# Patient Record
Sex: Male | Born: 1935 | Race: White | Marital: Married
Health system: Southern US, Community
[De-identification: ages and names within clinical notes are randomized; demographics above are authoritative.]

---

## 2015-12-29 ENCOUNTER — Ambulatory Visit
Admission: RE | Admit: 2015-12-29 | Discharge: 2015-12-29 | Disposition: A | Discharge status: Home / Self Care | Payer: PRIVATE HEALTH INSURANCE | Source: Ambulatory Visit | Attending: Family Medicine | Admitting: Family Medicine

## 2015-12-29 ENCOUNTER — Other Ambulatory Visit: Payer: Self-pay | Admitting: Family Medicine

## 2015-12-29 DIAGNOSIS — R0989 Other specified symptoms and signs involving the circulatory and respiratory systems: Secondary | ICD-10-CM

## 2016-01-02 ENCOUNTER — Other Ambulatory Visit: Payer: Self-pay | Admitting: Family Medicine

## 2016-01-02 DIAGNOSIS — K439 Ventral hernia without obstruction or gangrene: Secondary | ICD-10-CM

## 2016-01-06 ENCOUNTER — Ambulatory Visit
Admission: RE | Admit: 2016-01-06 | Discharge: 2016-01-06 | Disposition: A | Discharge status: Home / Self Care | Payer: PRIVATE HEALTH INSURANCE | Source: Ambulatory Visit | Attending: Family Medicine | Admitting: Family Medicine

## 2016-01-06 DIAGNOSIS — K439 Ventral hernia without obstruction or gangrene: Secondary | ICD-10-CM

## 2016-10-31 IMAGING — CT CT ABD-PELV W/O CM
2 of 4 series · 16 of 46 positions shown, 18 images · non-contrast
Comparison: None.

CLINICAL DATA: Right groin pain for 2 weeks. Left nephrectomy.
Diaphragmatic rupture repair surgery in 7001.

EXAM:
CT ABDOMEN AND PELVIS WITHOUT CONTRAST
TECHNIQUE: Multidetector CT imaging of the abdomen and pelvis was performed
following the standard protocol without IV contrast.

[Series 2: abd/pelvis w/(date) · axial · 0.70mm/px · z∈[-638,-258]mm · 13 of 84 slices shown, 15 images]
[im 4/84  soft-tissue]
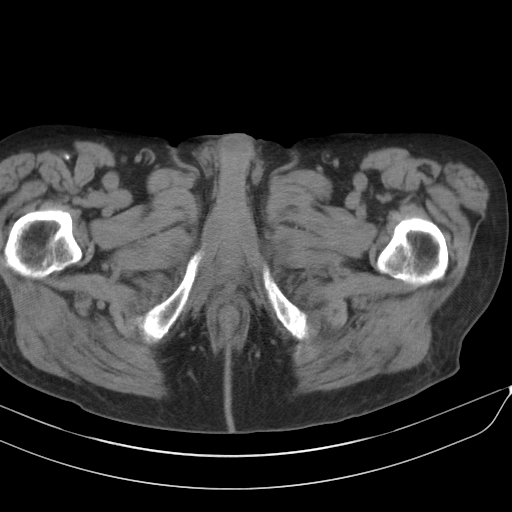
[im 4/84  bone]
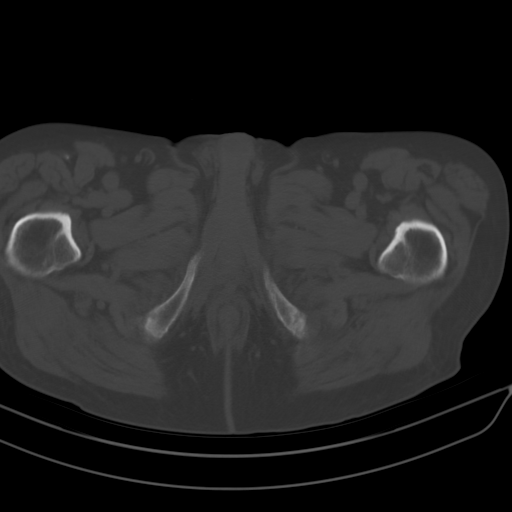
[im 10/84  soft-tissue]
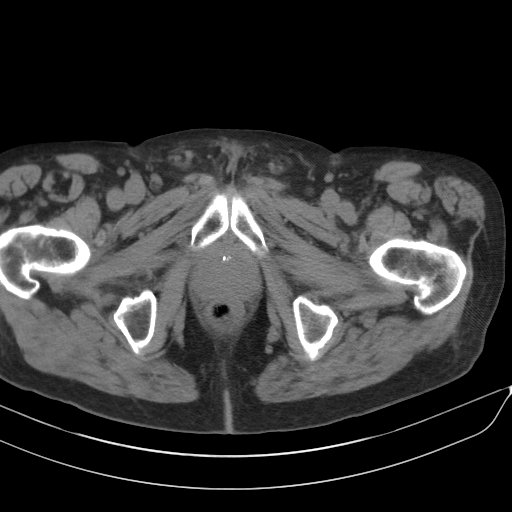
[im 17/84  soft-tissue]
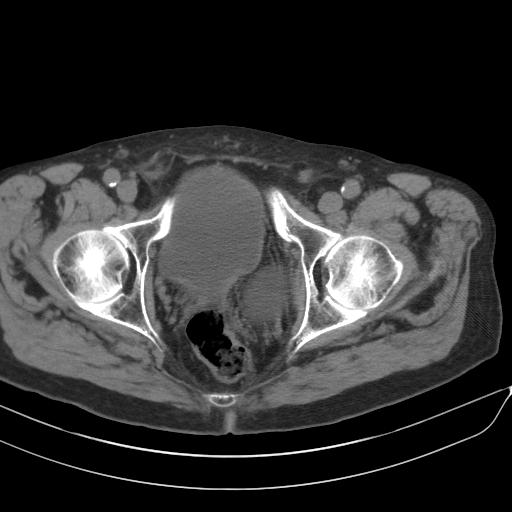
[im 24/84  soft-tissue]
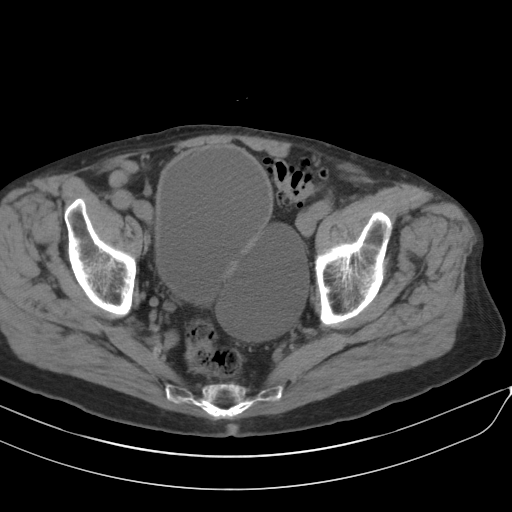
[im 30/84  soft-tissue]
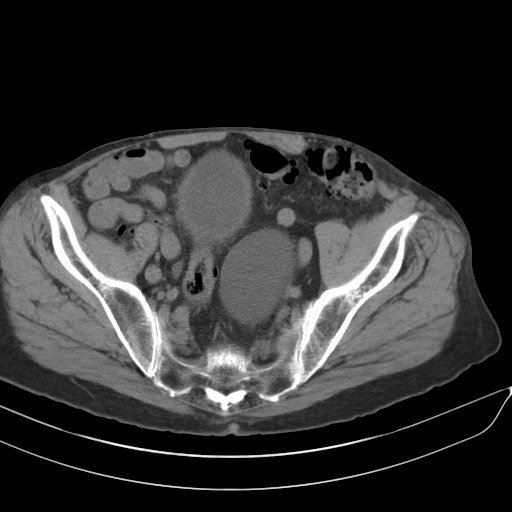
[im 37/84  soft-tissue]
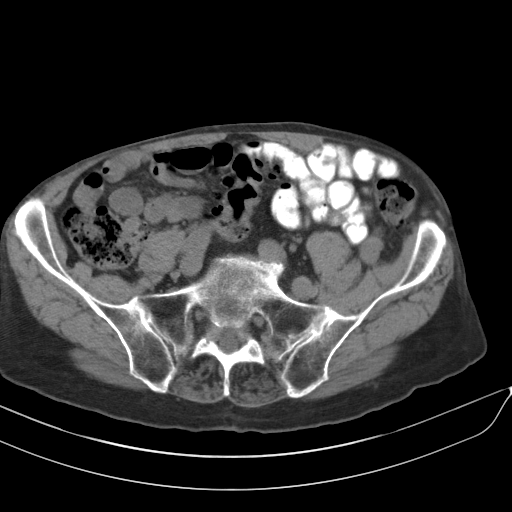
[im 44/84  soft-tissue]
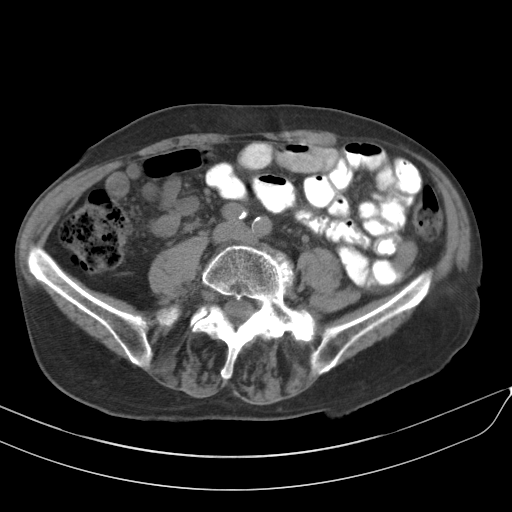
[im 47/84  soft-tissue]
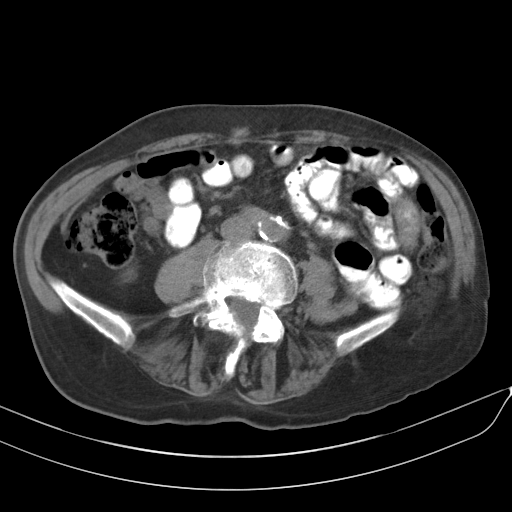
[im 54/84  soft-tissue]
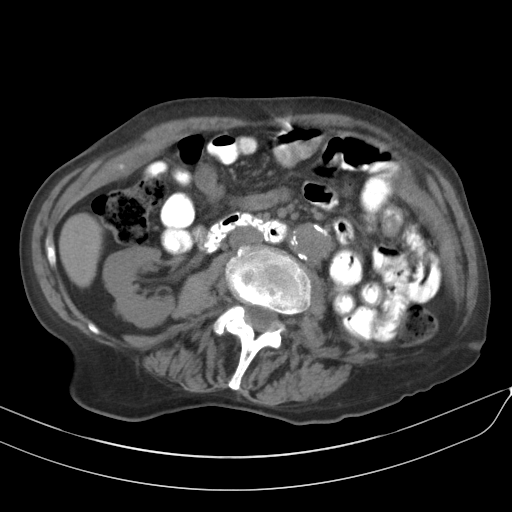
[im 54/84  bone]
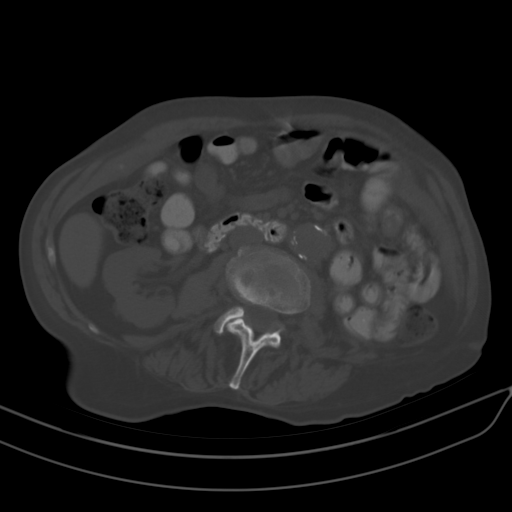
[im 60/84  soft-tissue]
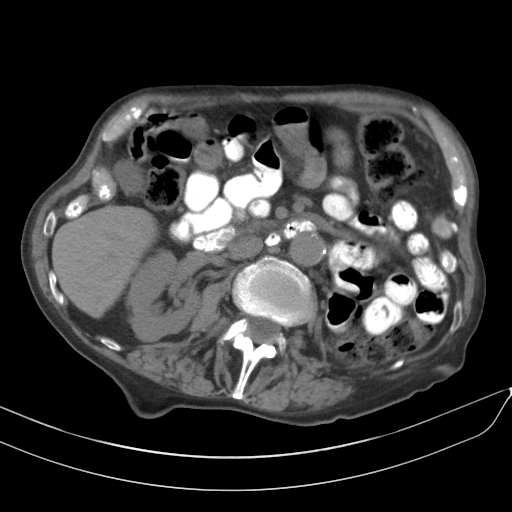
[im 67/84  soft-tissue]
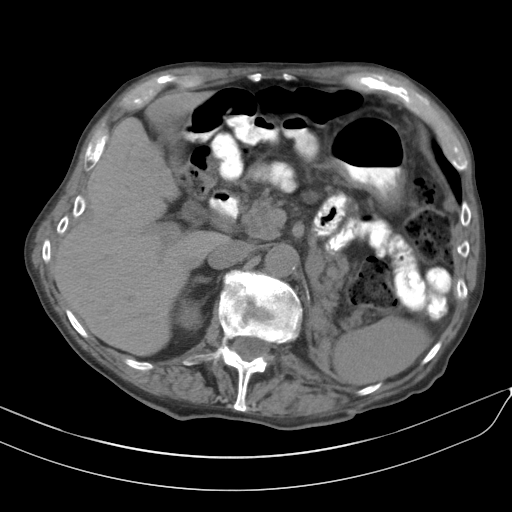
[im 74/84  soft-tissue]
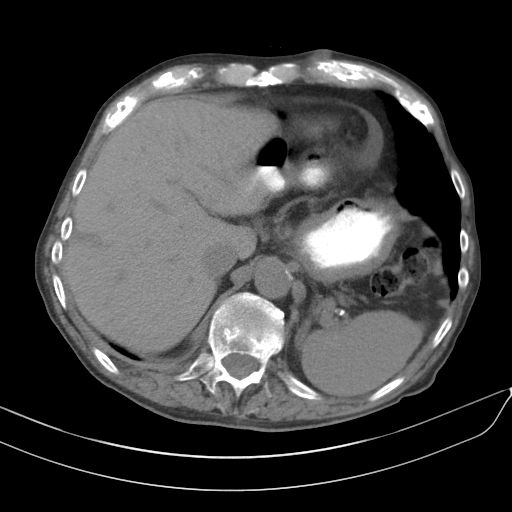
[im 80/84  soft-tissue]
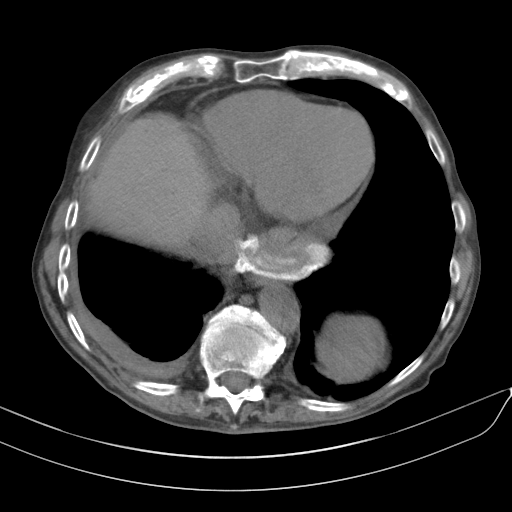

[Series 3: cor · coronal · 0.68mm/px · 3 of 85 slices shown]
[im 29/85  soft-tissue]
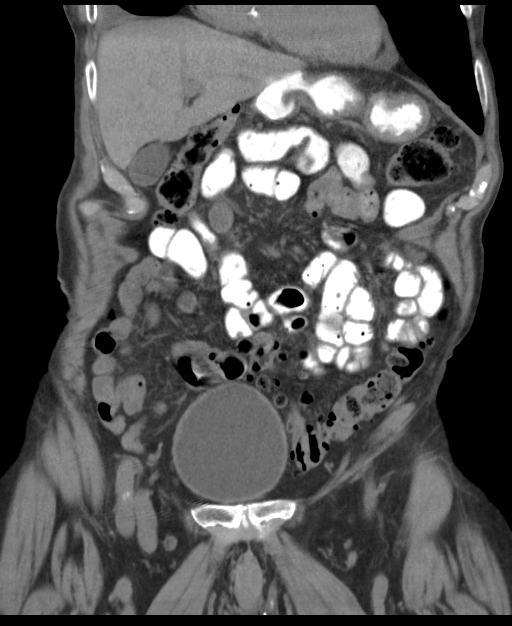
[im 38/85  soft-tissue]
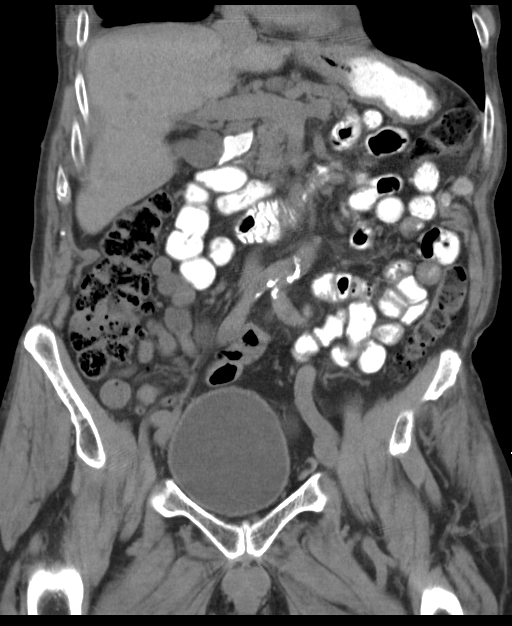
[im 47/85  soft-tissue]
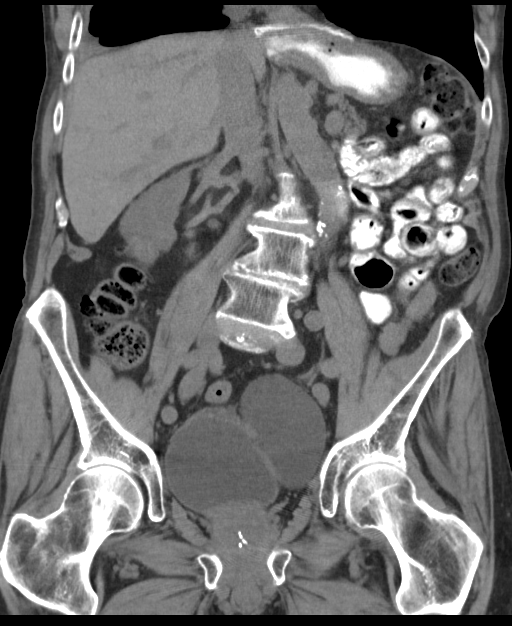

[16 of 46 positions shown; findings below may reference images not displayed]

FINDINGS: There is pleural thickening extending from the posterior right hemi
thorax, laterally, and to the anterior right hemi thorax. The
thickening is characterize by area is of soft tissue and fluid
density. There is a focal parenchymal ovoid opacity at the post role
lateral right lung base on image number 1 measuring 2.0 x 3.4 cm
that is partially imaged. Linear opacities extend centrally. This
likely represents round atelectasis.

Small pericardial effusion.  Moderate-sized hiatal hernia.

Unenhanced liver, gallbladder, spleen, pancreas are within normal
limits.

Right adrenal gland is unremarkable. The right kidney is somewhat
lobulated. The left kidney is absent. Left adrenal gland is
difficult to visualize.

The appendix is within normal limits. Diverticulosis of the
descending and sigmoid colon is present. There is no obvious of
evidence of acute diverticulitis. Mild apparent wall thickening of
the distal half of the sigmoid colon is felt to reflect
decompression.

The bladder is distended. There is a large fluid-filled structure to
the left of and posterior to the bladder measuring 8.2 x 5.7 cm.
This likely represents a cystocele and the communication to the
bladder is suspected on image 61. There are calcified density within
the dependent portion.

There is no evidence of inguinal hernia.

Levoscoliosis with the apex at L3-4. No vertebral compression
deformity.

There is no free intraperitoneal gas.

Vasculature is tortuous.

Prostate is mildly enlarged.
IMPRESSION: Chronic changes are suspected at the right lung base. There is an
ovoid opacity most likely due to round atelectasis. Followup in 3
months is recommended to ensure stability.

Large cystocele within the left hemipelvis. This may result in right
inguinal symptoms secondary to compression of the bladder. There are
calcified densities within the dependent portion of unknown
significance. Urinary calculi cannot be excluded.

No evidence of inguinal hernia.

Appendix is within normal limits

Sigmoid diverticulosis without acute diverticulitis.

## 2022-01-31 ENCOUNTER — Other Ambulatory Visit: Payer: Self-pay | Admitting: Gastroenterology

## 2022-02-01 ENCOUNTER — Encounter (HOSPITAL_COMMUNITY): Payer: Self-pay | Admitting: Gastroenterology

## 2022-02-01 NOTE — Progress Notes (Signed)
Attempted to obtain medical history via telephone, unable to reach at this time. Unable to leave a voicemail to return pre surgical testing department's phone call.  ?

## 2022-02-05 NOTE — H&P (Signed)
History of Present Illness  ?General:   ?       88 year old male, relocated from [place] to live with his daughter ?       cholecystectomy 09/24/21, chronic diarrhea thereafter, C diff test negative in 10/16/21 ?       MRCP 09/21/21: gallstones and CBD stones, tiny cystic pancreatic head lesion 5 MM, likely PMN ?       ERCP 09/22/21: extensive choledocholithiasis- removed with sphincterotomy and balloon sweeps, CBD stent placed, repeat recommendation 3 to 4 months to remove CBD stent ?       EUS 06/22/20: cystic fluid aspirated ?       labs from 01/29/22: hemoglobin 11.6, MCV 93.8, platelet 244 TB/AST/ALT/ALP 0.03/15/09/101, BUN 62, creatinine 3.82, GFR 14. ?       He feels "healthy", he gets up and wakes daily but has less energy, his main issue is diarrhea since his GB was removed. ?       He has a "blow out" with diarrhea and may not have a Bm for 2-3 days before recurrence of explosive diarrhea. ?       Denies trouble swallowing but c/o choking and gagging with water. ?       Denies pain on swallowing. ?       On days when he has diarrhea, he has stools in 1-2 stages, watery, loose, denies blood in stool or black stools. ?       Denies abdominal pain or rectal pain. ?       HE doesn't have a good appetite, he forces himself to eat for the past year, he used to weigh 150 lbs and now weighs 130 lbs. ?       He feels hungry and feels he cannot eat "rich food" like sweets or icecream or cookie. ?       Denies nausea, vomiting, denies acid reflux or heartburn. ?       Rarely, 1-2 times a year, he has noted food go down swallow, if he has not chewed it well.  ?  ? ?Current Medications Taking  ?Aspir-81 81 MG Tablet Delayed Release 1 tablet Orally Once a day ?Furosemide 40 MG Tablet 1 tablet Orally Once a day, Notes: lasix ?Isosorbide Mononitrate ER 30 MG Tablet Extended Release 24 Hour 1 tablet Orally Once a day, Notes: PRN for chest pain ?MiraLax(Polyethylene Glycol 3350) - Packet 1 packet mixed with 8 ounces of fluid  Orally Once a day, Notes: as needed ? Finasteride 5 MG Tablet 1 tablet Orally Once a day, Notes: proscar ?Metoprolol Succinate ER 25 MG Tablet Extended Release 24 Hour 0.5 Orally Once a day, Notes: toprol  ? ? ?Past Medical History  ?     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  ?     Left nephrectomy at age 74. ?  ? ?Surgical History  ?      ruptured diphragm 02/2015  ?      back surgery 2005  ?      left nephrectomy age 32  ?      gallbladder removal 09/2021   ?  ? ?Family History  ?Father: deceased 90 yrs, early 73  ?Mother: deceased 58 yrs, early 19  ?Brother 1: deceased 6 yrs, lymphoma  ?Sister 1: alive 31 yrs  ?Sister 2: alive 40 yrs  ?Sister 3: alive 54 yrs, diagnosed with Breast cancer  ?oldest son is in surgery today 01/03/16 for operation on spine. youngest son has pacemaker,  middle son healthy. ?Pt's mother died of cancer, unsure of what type. ?No family hx of colon cancer, liver disease, polyps.  ? ? ?Social History  ?General:   ?Tobacco use   ?    cigarettes:  Never smoked ?    Tobacco history last updated  01/31/2022 ?    Vaping  No ?no EXPOSURE TO PASSIVE SMOKE.  ?no Alcohol.  ?Caffeine: yes, 1 serving daily, coffee.  ?no Recreational drug use.  ?Exercise: yes, walks 1 mile daily.  ?Marital Status: single, widowed.  ?Children: 3 sons, 2 daughters.  ?OCCUPATION: retired.   ?  ? ?Allergies  ?Doxycycline: shortness of breath - Side Effects   ? ?Hospitalization/Major Diagnostic Procedure  ?none since surgery 09/2021 01/31/2022   ? ?Review of Systems  ?GI PROCEDURE:   ?       Pacemaker/ AICD no.  Artificial heart valves no.  MI/heart attack no.  Abnormal heart rhythm no.  Angina no.  CVA no.  Hypertension YES.  Hypotension no.  Asthma, COPD no.  Sleep apnea no.  Seizure disorders no.  Artificial joints no.  Diabetes no.  Significant headaches no.  Vertigo YES.  Depression/anxiety YES.  Abnormal bleeding no.  Kidney Disease no, but only one kidney.  Liver disease no.  Blood transfusion no.       ? ? ?Vital Signs  ?Wt 139.8, Wt change -3.8 lbs, Ht 68, BMI 21.25, Temp 97.9, Pulse sitting 61, BP sitting 110/60.  ?  ? ?Examination  ?Gastroenterology:: ?       GENERAL APPEARANCE:  frail, elderly, No acute distress, alert.  ?       SCLERA: anicteric.  ?       CARDIOVASCULAR Normal RRR .  ?       RESPIRATORY Breath sounds normal. Respiration even and unlabored.  ?       ABDOMEN No masses palpated. Liver and spleen not palpated, normal. Bowel sounds normal, Abdomen not distended.  ?       EXTREMITIES: No edema.  ?       NEURO: alert, oriented to time, place and person, uses a cane for ambulation.  ?       PSYCH: mood/affect normal.  ?  ?  ? ?Assessments  ?1. History of biliary stent insertion - Z98.890 (Primary)  ?2. 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 ?Treatment  ? ?1. History of biliary stent insertion   ?      IMAGING: Esophagoscopy ?Notes: As per ERCP report, CBD stones were all cleared and recent LFTS are normal. ?Recommend EGD for stent removal. ?Recommend holding ASA for 5 days prior to procedure. ?Due to his age, will have to perform the procedure as an outpatient at the hospital.     ? ?2. History of pancreatitis   ?Notes: S/P cholecystectomy, denies abdominal pain.     ? ?3. Bile salt-induced diarrhea   ?Start Colestipol HCl Tablet, 1 GM, 2 tablets, Orally, Once a day, 90 days, 180 Tablet, Refills 1 ?Notes: Advised to take colestipol 1 gm 2 pills in am. Disscussed about possibly developing constipation with colestipol, if he has hard stools or less frequent Bms, advised to reduce dose to colestipol 1 gm 1 pill a day.     ?  ? ?  ? ? ?

## 2022-02-05 NOTE — Anesthesia Preprocedure Evaluation (Addendum)
Anesthesia Evaluation  ?Patient identified by MRN, date of birth, ID band ?Patient awake ? ? ? ?Reviewed: ?Allergy & Precautions, NPO status , Patient's Chart, lab work & pertinent test results, reviewed documented beta blocker date and time  ? ?Airway ?Mallampati: I ? ?TM Distance: >3 FB ?Neck ROM: Full ? ? ? Dental ?no notable dental hx. ?(+) Teeth Intact, Dental Advisory Given ?  ?Pulmonary ?neg pulmonary ROS,  ?  ?Pulmonary exam normal ?breath sounds clear to auscultation ? ? ? ? ? ? Cardiovascular ?hypertension, Pt. on home beta blockers and Pt. on medications ?+CHF  ?Normal cardiovascular exam ?Rhythm:Regular Rate:Normal ? ? ?  ?Neuro/Psych ?negative neurological ROS ? negative psych ROS  ? GI/Hepatic ?negative GI ROS, Neg liver ROS,   ?Endo/Other  ?negative endocrine ROS ? Renal/GU ?negative Renal ROS  ?negative genitourinary ?  ?Musculoskeletal ?negative musculoskeletal ROS ?(+)  ? Abdominal ?  ?Peds ? Hematology ?negative hematology ROS ?(+)   ?Anesthesia Other Findings ? ? Reproductive/Obstetrics ? ?  ? ? ? ? ? ? ? ? ? ? ? ? ? ?  ?  ? ? ? ? ? ? ? ?Anesthesia Physical ?Anesthesia Plan ? ?ASA: 2 ? ?Anesthesia Plan: MAC  ? ?Post-op Pain Management:   ? ?Induction: Intravenous ? ?PONV Risk Score and Plan: Propofol infusion and Treatment may vary due to age or medical condition ? ?Airway Management Planned: Natural Airway ? ?Additional Equipment:  ? ?Intra-op Plan:  ? ?Post-operative Plan:  ? ?Informed Consent: I have reviewed the patients History and Physical, chart, labs and discussed the procedure including the risks, benefits and alternatives for the proposed anesthesia with the patient or authorized representative who has indicated his/her understanding and acceptance.  ? ? ? ?Dental advisory given ? ?Plan Discussed with: CRNA ? ?Anesthesia Plan Comments:   ? ? ? ? ? ? ?Anesthesia Quick Evaluation ? ?

## 2022-02-06 ENCOUNTER — Encounter (HOSPITAL_COMMUNITY)
Admission: RE | Disposition: A | Discharge status: Home / Self Care | Payer: Self-pay | Source: Home / Self Care | Attending: Gastroenterology

## 2022-02-06 ENCOUNTER — Encounter (HOSPITAL_COMMUNITY): Payer: Self-pay | Admitting: Gastroenterology

## 2022-02-06 ENCOUNTER — Other Ambulatory Visit: Payer: Self-pay

## 2022-02-06 ENCOUNTER — Ambulatory Visit (HOSPITAL_COMMUNITY): Payer: Medicare Other | Admitting: Certified Registered Nurse Anesthetist

## 2022-02-06 ENCOUNTER — Ambulatory Visit (HOSPITAL_COMMUNITY)
Admission: RE | Admit: 2022-02-06 | Discharge: 2022-02-06 | Disposition: A | Discharge status: Home / Self Care | Payer: Medicare Other | Attending: Gastroenterology | Admitting: Gastroenterology

## 2022-02-06 ENCOUNTER — Ambulatory Visit (HOSPITAL_BASED_OUTPATIENT_CLINIC_OR_DEPARTMENT_OTHER): Payer: Medicare Other | Admitting: Certified Registered Nurse Anesthetist

## 2022-02-06 DIAGNOSIS — I509 Heart failure, unspecified: Secondary | ICD-10-CM | POA: Diagnosis not present

## 2022-02-06 DIAGNOSIS — I110 Hypertensive heart disease with heart failure: Secondary | ICD-10-CM

## 2022-02-06 DIAGNOSIS — K3189 Other diseases of stomach and duodenum: Secondary | ICD-10-CM | POA: Diagnosis not present

## 2022-02-06 DIAGNOSIS — Z79899 Other long term (current) drug therapy: Secondary | ICD-10-CM | POA: Diagnosis not present

## 2022-02-06 DIAGNOSIS — Z9889 Other specified postprocedural states: Secondary | ICD-10-CM

## 2022-02-06 DIAGNOSIS — K9089 Other intestinal malabsorption: Secondary | ICD-10-CM | POA: Insufficient documentation

## 2022-02-06 DIAGNOSIS — Z8719 Personal history of other diseases of the digestive system: Secondary | ICD-10-CM | POA: Insufficient documentation

## 2022-02-06 DIAGNOSIS — Z9049 Acquired absence of other specified parts of digestive tract: Secondary | ICD-10-CM | POA: Insufficient documentation

## 2022-02-06 DIAGNOSIS — Z4659 Encounter for fitting and adjustment of other gastrointestinal appliance and device: Secondary | ICD-10-CM | POA: Diagnosis not present

## 2022-02-06 HISTORY — PX: ESOPHAGOGASTRODUODENOSCOPY: SHX5428

## 2022-02-06 HISTORY — PX: GASTROINTESTINAL STENT REMOVAL: SHX6384

## 2022-02-06 SURGERY — EGD (ESOPHAGOGASTRODUODENOSCOPY)
Anesthesia: Monitor Anesthesia Care

## 2022-02-06 MED ORDER — PROPOFOL 1000 MG/100ML IV EMUL
INTRAVENOUS | Status: AC
  Filled 2022-02-06: qty 100

## 2022-02-06 MED ORDER — PROPOFOL 500 MG/50ML IV EMUL
INTRAVENOUS | Status: DC | PRN
  Administered 2022-02-06: 100 ug/kg/min via INTRAVENOUS

## 2022-02-06 MED ORDER — EPHEDRINE SULFATE-NACL 50-0.9 MG/10ML-% IV SOSY
PREFILLED_SYRINGE | INTRAVENOUS | Status: DC | PRN
  Administered 2022-02-06: 5 mg via INTRAVENOUS

## 2022-02-06 MED ORDER — SODIUM CHLORIDE 0.9 % IV SOLN
INTRAVENOUS | Status: DC
Start: 2022-02-06 — End: 2022-02-06

## 2022-02-06 MED ORDER — SODIUM CHLORIDE 0.9 % IV SOLN: INTRAVENOUS | Status: DC

## 2022-02-06 MED ORDER — LACTATED RINGERS IV SOLN: INTRAVENOUS | Status: DC | PRN

## 2022-02-06 MED ORDER — PROPOFOL 10 MG/ML IV BOLUS
INTRAVENOUS | Status: DC | PRN
  Administered 2022-02-06 (×3): 20 mg via INTRAVENOUS

## 2022-02-06 NOTE — Op Note (Signed)
[facility] ?Patient Name: Dean Gonzalez ?Procedure Date: 02/06/2022 ?MRN: [id number] ?Attending MD: Ronnette Juniper , MD ?Date of Birth: May 02, 1925 ?CSN: [id number] ?Age: 88 ?Admit Type: Inpatient ?Procedure:                Upper GI endoscopy ?Indications:              Biliary stent removal ?Providers:                Ronnette Juniper, MD, Despina Pole, Technician, Cindee Salt  ?                          Teaching laboratory technician, Dellie Catholic, Carlyn Reichert, RN ?Referring MD:             Cammy Brochure ?Medicines:                Monitored Anesthesia Care ?Complications:            No immediate complications. Estimated blood loss:  ?                          Minimal. ?Estimated Blood Loss:     Estimated blood loss was minimal. ?Procedure:                Pre-Anesthesia Assessment: ?                          - Prior to the procedure, a History and Physical  ?                          was performed, and patient medications and  ?                          allergies were reviewed. The patient's tolerance of  ?                          previous anesthesia was also reviewed. The risks  ?                          and benefits of the procedure and the sedation  ?                          options and risks were discussed with the patient.  ?                          All questions were answered, and informed consent  ?                          was obtained. Prior Anticoagulants: The patient has  ?                          taken no previous anticoagulant or antiplatelet  ?                          agents except for aspirin, about 10 days ago. ASA  ?  Grade Assessment: II - A patient with mild systemic  ?                          disease. After reviewing the risks and benefits,  ?                          the patient was deemed in satisfactory condition to  ?                          undergo the procedure. ?                          After obtaining informed consent, the endoscope was  ?                           passed under direct vision. Throughout the  ?                          procedure, the patient's blood pressure, pulse, and  ?                          oxygen saturations were monitored continuously. The  ?                          GIF-[id number] ([id number]) Olympus endoscope was introduced  ?                          through the mouth, and advanced to the second part  ?                          of duodenum. The upper GI endoscopy was  ?                          accomplished without difficulty. The patient  ?                          tolerated the procedure well. ?Scope In: ?Scope Out: ?Findings: ?     The examined esophagus was normal. ?     A few localized small erosions with no bleeding and no stigmata of  ?     recent bleeding were found in the gastric antrum. ?     Evidence of a fundoplication was found in the gastric fundus. The wrap  ?     appeared intact. This was traversed. ?     Localized mildly erythematous mucosa without bleeding was found in the  ?     gastric body, at the incisura and in the gastric antrum. ?     A previously placed plastic biliary stent was seen in the ampulla. Stent  ?     removal was accomplished with a snare. ?Impression:               - Normal esophagus. ?                          - Erosive gastropathy with no bleeding and no  ?  stigmata of recent bleeding. ?                          - A fundoplication was found. The wrap appears  ?                          intact. ?                          - Erythematous mucosa in the gastric body, incisura  ?                          and antrum. ?                          - Plastic biliary stent in the duodenum. Removed. ?Moderate Sedation: ?     Patient did not receive moderate sedation for this procedure, but  ?     instead received monitored anesthesia care. ?Recommendation:           - Patient has a contact number available for  ?                          emergencies. The signs and symptoms of potential  ?                           delayed complications were discussed with the  ?                          patient. Return to normal activities tomorrow.  ?                          Written discharge instructions were provided to the  ?                          patient. ?                          - Resume regular diet. ?                          - Continue present medications. ?Procedure Code(s):        --- Professional --- ?                          [phone], Esophagogastroduodenoscopy, flexible,  ?                          transoral; with removal of foreign body(s) ?Diagnosis Code(s):        --- Professional --- ?                          K31.89, Other diseases of stomach and duodenum ?                          Z98.890, Other specified postprocedural states ?  Z46.59, Encounter for fitting and adjustment of  ?                          other gastrointestinal appliance and device ?CPT copyright 2019 [employer]. All rights reserved. ?The codes documented in this report are preliminary and upon coder review may  ?be revised to meet current compliance requirements. ?Ronnette Juniper, MD ?02/06/2022 7:57:38 AM ?This report has been signed electronically. ?Number of Addenda: 0 ?

## 2022-02-06 NOTE — Discharge Instructions (Signed)

## 2022-02-06 NOTE — Interval H&P Note (Signed)
History and Physical Interval Note: ?89/male with CBD stent for EGD and stent removal with propofol. ? ?02/06/2022 ?7:35 AM ? ?Dean Gonzalez  has presented today for EGD with stent removal, with the diagnosis of hx of biliary stent insertion.  The various methods of treatment have been discussed with the patient and family. After consideration of risks, benefits and other options for treatment, the patient has consented to  Procedure(s): ?ESOPHAGOGASTRODUODENOSCOPY (EGD) (N/A) ?GASTROINTESTINAL STENT REMOVAL (N/A) as a surgical intervention.  The patient's history has been reviewed, patient examined, no change in status, stable for surgery.  I have reviewed the patient's chart and labs.  Questions were answered to the patient's satisfaction.   ? ? ?Ronnette Juniper ? ? ?

## 2022-02-06 NOTE — Anesthesia Postprocedure Evaluation (Signed)
Anesthesia Post Note ? ?Patient: Romano Stigger ? ?Procedure(s) Performed: ESOPHAGOGASTRODUODENOSCOPY (EGD) ?GASTROINTESTINAL STENT REMOVAL ? ?  ? ?Patient location during evaluation: Endoscopy ?Anesthesia Type: MAC ?Level of consciousness: awake and alert ?Pain management: pain level controlled ?Vital Signs Assessment: post-procedure vital signs reviewed and stable ?Respiratory status: spontaneous breathing, nonlabored ventilation, respiratory function stable and patient connected to nasal cannula oxygen ?Cardiovascular status: blood pressure returned to baseline and stable ?Postop Assessment: no apparent nausea or vomiting ?Anesthetic complications: no ? ? ?No notable events documented. ? ?Last Vitals:  ?Vitals:  ? 02/06/22 0810 02/06/22 0820  ?BP: 131/65 137/61  ?Pulse: 62 61  ?Resp: (!) 22 (!) 21  ?Temp:    ?SpO2: 100% 100%  ?  ?Last Pain:  ?Vitals:  ? 02/06/22 0820  ?TempSrc:   ?PainSc: 0-No pain  ? ? ?  ?  ?  ?  ?  ?  ? ?Arita Severtson L Cauy Melody ? ? ? ? ?

## 2022-02-06 NOTE — Transfer of Care (Signed)
Immediate Anesthesia Transfer of Care Note ? ?Patient: Dean Gonzalez ? ?Procedure(s) Performed: ESOPHAGOGASTRODUODENOSCOPY (EGD) ?GASTROINTESTINAL STENT REMOVAL ? ?Patient Location: Endoscopy Unit ? ?Anesthesia Type:MAC ? ?Level of Consciousness: drowsy and patient cooperative ? ?Airway & Oxygen Therapy: Patient Spontanous Breathing and Patient connected to face mask ? ?Post-op Assessment: Report given to RN and Post -op Vital signs reviewed and stable ? ?Post vital signs: Reviewed and stable ? ?Last Vitals:  ?Vitals Value Taken Time  ?BP 122/53 02/06/22 0800  ?Temp    ?Pulse 60 02/06/22 0800  ?Resp 21 02/06/22 0800  ?SpO2 100 % 02/06/22 0800  ? ? ?Last Pain:  ?Vitals:  ? 02/06/22 0705  ?TempSrc: Oral  ?PainSc: 0-No pain  ?   ? ?  ? ?Complications: No notable events documented. ?

## 2022-02-21 ENCOUNTER — Ambulatory Visit (INDEPENDENT_AMBULATORY_CARE_PROVIDER_SITE_OTHER): Payer: Medicare Other | Admitting: Internal Medicine

## 2022-02-21 ENCOUNTER — Encounter: Payer: Self-pay | Admitting: Internal Medicine

## 2022-02-21 ENCOUNTER — Other Ambulatory Visit: Payer: Self-pay

## 2022-02-21 VITALS — BP 103/58 | HR 75 | Ht 70.0 in | Wt 144.0 lb

## 2022-02-21 DIAGNOSIS — N184 Chronic kidney disease, stage 4 (severe): Secondary | ICD-10-CM | POA: Diagnosis not present

## 2022-02-21 DIAGNOSIS — E785 Hyperlipidemia, unspecified: Secondary | ICD-10-CM | POA: Diagnosis not present

## 2022-02-21 DIAGNOSIS — I10 Essential (primary) hypertension: Secondary | ICD-10-CM | POA: Diagnosis not present

## 2022-02-21 DIAGNOSIS — I5032 Chronic diastolic (congestive) heart failure: Secondary | ICD-10-CM | POA: Diagnosis not present

## 2022-02-21 MED ORDER — FUROSEMIDE 40 MG PO TABS: 40.0000 mg | ORAL_TABLET | Freq: Every day | ORAL | 3 refills | Status: AC

## 2022-02-21 MED ORDER — ISOSORBIDE MONONITRATE ER 30 MG PO TB24: 30.0000 mg | ORAL_TABLET | Freq: Every day | ORAL | 3 refills | Status: DC

## 2022-02-21 NOTE — Progress Notes (Signed)
?Cardiology Office Note:   ? ?Date:  02/21/2022  ? ?ID:  Dean Gonzalez, DOB 10/15/1925, MRN [id number] ? ?PCP:  Caren Macadam, MD  ? ?[facility] Providers ?Cardiologist:  Lenna Sciara, MD ?Referring MD: Olen Cordial, PA  ? ?Chief Complaint/Reason for Referral: Shortness of breath; establish cardiovascular care. ? ?ASSESSMENT:   ? ?1. Chronic diastolic heart failure ([place])   ?2. Hyperlipidemia, unspecified hyperlipidemia type   ?3. Hypertension, unspecified type   ?4. CKD (chronic kidney disease) stage 4, GFR 15-29 ml/min (HCC)   ? ? ?PLAN:   ? ?In order of problems listed above: ?1.  Not a candidate for SGLT2 inhibitor due to poor renal function.  Continue current medical therapy, he is euvolemic on exam..  Did have a long discussion with the patient and his daughter about how aggressive we should be and end-of-life issues.  Follow-up in 6 months or earlier if needed. ?2.  This is being followed by the patient's primary care provider. ?3.  Blood pressure is under adequate control. ?4.  The patient is to see nephrology soon. ? ? ?Dispo:  Return in about 6 months (around 08/24/2022).  ? ?  ? ?Medication Adjustments/Labs and Tests Ordered: ?Current medicines are reviewed at length with the patient today.  Concerns regarding medicines are outlined above.  ? ?Tests Ordered: ?Orders Placed This Encounter  ?Procedures  ? EKG 12-Lead  ? ? ?Medication Changes: ?No orders of the defined types were placed in this encounter. ? ? ?History of Present Illness:   ? ?FOCUSED PROBLEM LIST:   ?1.  Diastolic heart failure ?2.  Hyperlipidemia ?3.  Hypertension ?4.  Renal rupture s/p resection CKD stage IV GFR 20s, Cr 2.3-2.5  ? ?The patient is a 88 y.o. male with the indicated medical history here to establish cardiovascular care.  He had been followed in [place] prior to moving down here to [facility] recently.  He was being followed by cardiologist there for diastolic dysfunction.  His last limited echo demonstrated normal  ejection fraction with no significant valvular issues.  He was seen by his primary care provider recently with complaints of increasing shortness of breath. ? ?He is doing fairly well.  He denies any significant shortness of breath at rest.  He does get short of breath when he exerts himself more than moderately.  He denies any chest pain, palpitations, paroxysmal nocturnal dyspnea, orthopnea, or severe peripheral edema.  He is required no emergency room visits or hospitalizations.  By his own admission he is slowing down.  He spends most of his days in his office reading and taking care of his financials.  He does not live with his daughter.  He does walk around at times but does not do anything too too strenuous. ? ?   ?  ?Previous Medical History: ?History reviewed. No pertinent past medical history. ? ? ?Current Medications: ?Current Meds  ?Medication Sig  ? colestipol (COLESTID) 1 g tablet Take 1 g by mouth 2 (two) times daily.  ? finasteride (PROSCAR) 5 MG tablet Take 5 mg by mouth daily.  ? furosemide (LASIX) 40 MG tablet Take 40 mg by mouth daily.  ? isosorbide mononitrate (IMDUR) 30 MG 24 hr tablet Take 30 mg by mouth daily.  ? metoprolol succinate (TOPROL-XL) 25 MG 24 hr tablet Take 12.5 mg by mouth daily.  ? nitroGLYCERIN (NITROSTAT) 0.4 MG SL tablet Place 0.4 mg under the tongue every 2 (two) hours as needed.  ?  ? ?Allergies:    ?  Doxycycline  ? ?Social History:   ?Social History  ? ?Tobacco Use  ? Smoking status: Unknown  ?  ? ?Family Hx: ?History reviewed. No pertinent family history.  ? ?Review of Systems:   ?Please see the history of present illness.    ?All other systems reviewed and are negative. ?  ? ? ?EKGs/Labs/Other Test Reviewed:   ? ?EKG: EKG reviewed by me demonstrates sinus rhythm and occasional PACs ? ?Prior CV studies: ? ?Per Care Everywhere: ? ?Echo limited 02/08/2021: LVEF 58% ?Mildly increased left ventricular wall thickness. ?Normal left ventricular chamber size and systolic function  with no regional wall motion abnormalities. LV EF 58 %. ?Grade I diastolic dysfunction of the left ventricle (impaired relaxation pattern). ?Left atrial volume index of 40.6 ml/m?. ?Aortic valve mean gradient 4 mmHg. ?Normal right ventricular systolic pressure; 28 mmHg. ?Trivial aortic valve regurgitation. ?As compared to previous echocardiogram 06/14/2020 valvular regurgitation is less severe. ? ?Echocardiogram 06/14/2020: EF 60% ?Bioprosthetic aortic valve. ?Moderate prosthetic aortic valve regurgitation. ?Mild-to-moderate tricuspid valve regurgitation. ?Mild mitral valve regurgitation. ?Mildly increased left ventricular wall thickness. ?Normal left ventricular systolic function. ? ?48 Hour Holter Monitor 03/17/2019: ?CONCLUSION: ?The patient has one 8-beat paroxysm of atrial fibrillation, but is otherwise an unremarkable Holter monitor. ? ?Echocardiogram 08/15/2016: ?Normal left ventricular size and systolic function, EF 85%. ?Mildly increased left atrial size. ?Aortic valve sclerosis without stenosis.  ?Moderate aortic valve regurgitation. ?Mild tricuspid valve regurgitation. ?Moderately dilated ascending aorta, 4.1 cm. ? ?Stress Test 05/16/2015:  ?This stress test is within normal limits and is negative for ischemia.  At the time of this dictation, the Cardiolite portion is still pending.  Correlation with nuclear scan is recommended. ?Nuclear Portion: ?1.  No evidence of Regadenoson induced ischemia. ?2.  No evidence of previous myocardial injury pattern. ?3.  Normal left ventricular size with satisfactory resting systolic performance. ? ?Echocardiogram 02/14/2015:  ?[facility] ?SUMMARY: ?Left ventricle is normal size with normal systolic function. ?Visually estimated LVEF is 55-60%. ?LV strain imaging shows reduced peak systolic strain measurements (-8 to -12) in the anterior septum without associated wall motion abnormality. ?There is borderline concentric left ventricular hypertrophy. ?There is mild  diastolic dysfunction present. ?The right ventricle is normal size with normal systolic function. ?The left atrium is mildly enlarged based on estimated volume. ?Mild aortic regurgitation. ?Estimated PA systolic pressure by tricuspid regurgitant Doppler velocity is 38 mmHg. ?No previous echocardiogram on this patient found in [facility] system ? ?Recent Labs: ?External labs from this month demonstrate a sodium 141, potassium 5.1, creatinine 3.96, GFR 13, LFTs within normal limits, hemoglobin 11, platelets 212 ? ?Risk Assessment/Calculations:   ? ?  ?    ? ?Physical Exam:   ? ?VS:  BP (!) 103/58   Pulse 75   Ht 5\' 10"  (1.778 m)   Wt 144 lb (65.3 kg)   SpO2 97%   BMI 20.66 kg/m?    ?Wt Readings from Last 3 Encounters:  ?02/21/22 144 lb (65.3 kg)  ?02/06/22 136 lb (61.7 kg)  ?  ?GENERAL:  No apparent distress, AOx3 ?HEENT:  No carotid bruits, +2 carotid impulses, no scleral icterus ?CAR: RRR no murmurs, gallops, rubs, or thrills ?RES:  Clear to auscultation bilaterally ?ABD:  Soft, nontender, nondistended, positive bowel sounds x 4 ?VASC:  +2 radial pulses, +2 carotid pulses, palpable pedal pulses ?NEURO:  CN 2-12 grossly intact; motor and sensory grossly intact ?PSYCH:  No active depression or anxiety ?EXT:  No edema, ecchymosis, or cyanosis ? ?Signed, ?Clarene Duke  Luiz Iron, MD  ?02/21/2022 2:32 PM    ?[facility] [address], [place], [place]  [address] ?Phone: [phone]; Fax: [phone]  ? ?Note:  This document was prepared using Dragon voice recognition software and may include unintentional dictation errors. ?

## 2022-02-21 NOTE — Patient Instructions (Signed)
Medication Instructions:  ?No changes ?*If you need a refill on your cardiac medications before your next appointment, please call your pharmacy* ? ? ?Lab Work: ?none ?If you have labs (blood work) drawn today and your tests are completely normal, you will receive your results only by: ?MyChart Message (if you have MyChart) OR ?A paper copy in the mail ?If you have any lab test that is abnormal or we need to change your treatment, we will call you to review the results. ? ? ?Testing/Procedures: ?Your physician has requested that you have an echocardiogram. Echocardiography is a painless test that uses sound waves to create images of your heart. It provides your doctor with information about the size and shape of your heart and how well your heart?s chambers and valves are working. This procedure takes approximately one hour. There are no restrictions for this procedure. ? ? ?Follow-Up: ?At [facility], you and your health needs are our priority.  As part of our continuing mission to provide you with exceptional heart care, we have created designated Provider Care Teams.  These Care Teams include your primary Cardiologist (physician) and Advanced Practice Providers (APPs -  Physician Assistants and Nurse Practitioners) who all work together to provide you with the care you need, when you need it. ? ?We recommend signing up for the patient portal called "MyChart".  Sign up information is provided on this After Visit Summary.  MyChart is used to connect with patients for Virtual Visits (Telemedicine).  Patients are able to view lab/test results, encounter notes, upcoming appointments, etc.  Non-urgent messages can be sent to your provider as well.   ?To learn more about what you can do with MyChart, go to [email or web address].   ? ?Your next appointment:   ?6 month(s) ? ?The format for your next appointment:   ?In Person ? ?Provider:   ?None   ?  ?

## 2022-03-08 ENCOUNTER — Ambulatory Visit (HOSPITAL_COMMUNITY): Payer: Medicare Other | Attending: Cardiovascular Disease

## 2022-03-08 DIAGNOSIS — E785 Hyperlipidemia, unspecified: Secondary | ICD-10-CM | POA: Insufficient documentation

## 2022-03-08 DIAGNOSIS — N184 Chronic kidney disease, stage 4 (severe): Secondary | ICD-10-CM | POA: Diagnosis present

## 2022-03-08 DIAGNOSIS — I5032 Chronic diastolic (congestive) heart failure: Secondary | ICD-10-CM | POA: Insufficient documentation

## 2022-03-08 DIAGNOSIS — I10 Essential (primary) hypertension: Secondary | ICD-10-CM | POA: Diagnosis present

## 2022-03-08 LAB — ECHOCARDIOGRAM COMPLETE
Area-P 1/2: 3.42 cm2
P 1/2 time: 540 msec
S' Lateral: 2.9 cm

## 2022-03-27 ENCOUNTER — Telehealth: Payer: Self-pay | Admitting: Internal Medicine

## 2022-03-27 NOTE — Telephone Encounter (Signed)
? ?  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 They wanted to let Dr, Ali Lowe know and what they need to do next ?

## 2022-03-27 NOTE — Telephone Encounter (Signed)
Left message for Cecille Rubin [facility]) to call back.  I do not see a DPR in chart.  Dilated aorta was noted on recent echo.  Dr. Ali Lowe aware and plans to monitor this and this was discussed w patient when I called him w echo results. ?

## 2022-04-10 ENCOUNTER — Encounter: Payer: Self-pay | Admitting: Podiatry

## 2022-04-10 ENCOUNTER — Ambulatory Visit (INDEPENDENT_AMBULATORY_CARE_PROVIDER_SITE_OTHER): Payer: Medicare Other | Admitting: Podiatry

## 2022-04-10 DIAGNOSIS — M79675 Pain in left toe(s): Secondary | ICD-10-CM | POA: Diagnosis not present

## 2022-04-10 DIAGNOSIS — B351 Tinea unguium: Secondary | ICD-10-CM | POA: Diagnosis not present

## 2022-04-10 DIAGNOSIS — M79674 Pain in right toe(s): Secondary | ICD-10-CM

## 2022-04-10 NOTE — Progress Notes (Signed)
?  Subjective:  ?Patient ID: Dean Gonzalez, male    DOB: [birth year],  MRN: [id number] ? ?Chief Complaint  ?Patient presents with  ? nail trim  ?  non diabetic nail trim  ? ? ?88 y.o. male presents with the above complaint. History confirmed with patient.  His toenails are thickened elongated and difficult to cut they have also some discoloration.  They cause pain in shoes when they get long. ? ?Objective:  ?Physical Exam: ?warm, good capillary refill, no trophic changes or ulcerative lesions, normal DP and PT pulses, and normal sensory exam. ?Left Foot: dystrophic yellowed discolored nail plates with subungual debris ?Right Foot: dystrophic yellowed discolored nail plates with subungual debris ? ? ?Assessment:  ? ?1. Pain due to onychomycosis of toenails of both feet   ? ? ? ?Plan:  ?Patient was evaluated and treated and all questions answered. ? ?Discussed the etiology and treatment options for the condition in detail with the patient. Educated patient on the topical and oral treatment options for mycotic nails. Recommended debridement of the nails today. Sharp and mechanical debridement performed of all painful and mycotic nails today. Nails debrided in length and thickness using a nail nipper to level of comfort. Discussed treatment options including appropriate shoe gear. Follow up as needed for painful nails. ? ? ?Return in about 3 months (around 07/11/2022) for painful thickened nails.  ? ?

## 2022-04-24 ENCOUNTER — Telehealth: Payer: Self-pay

## 2022-04-24 NOTE — Telephone Encounter (Signed)
Spoke with patient and scheduled a in person Palliative Consult for 05/03/22 @ 10 AM.   Consent obtained; updated Netsmart, Team List and Epic.

## 2022-04-27 ENCOUNTER — Telehealth: Payer: Self-pay | Admitting: Internal Medicine

## 2022-04-27 NOTE — Telephone Encounter (Signed)
Follow Up:     Returning calls from this week, but did not know who called.

## 2022-04-27 NOTE — Telephone Encounter (Signed)
I spoke w Dean Gonzalez and the patient and advised that I am not aware of any calls to them today.

## 2022-05-03 ENCOUNTER — Other Ambulatory Visit: Payer: Medicare Other | Admitting: Family Medicine

## 2022-05-03 ENCOUNTER — Encounter: Payer: Self-pay | Admitting: Nephrology

## 2022-05-03 ENCOUNTER — Other Ambulatory Visit (HOSPITAL_COMMUNITY): Payer: Self-pay

## 2022-05-03 VITALS — BP 134/84 | HR 69 | Resp 18

## 2022-05-03 DIAGNOSIS — Z515 Encounter for palliative care: Secondary | ICD-10-CM

## 2022-05-03 DIAGNOSIS — D631 Anemia in chronic kidney disease: Secondary | ICD-10-CM | POA: Insufficient documentation

## 2022-05-03 DIAGNOSIS — Z905 Acquired absence of kidney: Secondary | ICD-10-CM

## 2022-05-03 DIAGNOSIS — E222 Syndrome of inappropriate secretion of antidiuretic hormone: Secondary | ICD-10-CM

## 2022-05-03 DIAGNOSIS — I2510 Atherosclerotic heart disease of native coronary artery without angina pectoris: Secondary | ICD-10-CM

## 2022-05-03 DIAGNOSIS — N186 End stage renal disease: Secondary | ICD-10-CM

## 2022-05-03 DIAGNOSIS — I7121 Aneurysm of the ascending aorta, without rupture: Secondary | ICD-10-CM

## 2022-05-03 DIAGNOSIS — I5020 Unspecified systolic (congestive) heart failure: Secondary | ICD-10-CM | POA: Insufficient documentation

## 2022-05-03 HISTORY — DX: Aneurysm of the ascending aorta, without rupture: I71.21

## 2022-05-03 HISTORY — DX: End stage renal disease: N18.6

## 2022-05-03 HISTORY — DX: Unspecified systolic (congestive) heart failure: I50.20

## 2022-05-03 HISTORY — DX: Atherosclerotic heart disease of native coronary artery without angina pectoris: I25.10

## 2022-05-03 HISTORY — DX: Acquired absence of kidney: Z90.5

## 2022-05-03 NOTE — Progress Notes (Unsigned)
[facility] Palliative Care Consult Note Telephone: [phone]  Fax: [phone]   Date of encounter: 05/03/22 10:48 AM PATIENT NAME: Dean Gonzalez [place] [address]   [phone] (home)  DOB: July 29, 1925 MRN: [id number] PRIMARY CARE PROVIDER:    Caren Macadam, MD,  [place] [address] [place]:   Caren Macadam, [place],  [place] [address] [phone]  RESPONSIBLE PARTY:    Contact Information     Name Relation Home Work Mobile   [facility]  [phone]  [phone]        I met face to face with patient and family in *** home/facility. Palliative Care was asked to follow this patient by consultation request of  Caren Macadam, MD to address advance care planning and complex medical decision making. This is the initial visit.          ASSESSMENT, SYMPTOM MANAGEMENT AND PLAN / RECOMMENDATIONS:     Follow up Palliative Care Visit: Palliative care will continue to follow for complex medical decision making, advance care planning, and clarification of goals. Return *** weeks or prn.    This visit was coded based on medical decision making (MDM).  PPS: ***0%  HOSPICE ELIGIBILITY/DIAGNOSIS: TBD  Chief Complaint: ***  HISTORY OF PRESENT ILLNESS:  Dean Gonzalez is a 88 y.o. year old male with renal failure. 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 He sees Dr Candiss Norse on June 15th. No falls, unsteady gait and occasional stumbling.  No incontinence, alternates with constipation and loose stool at times. Eating and drinking very well.  Denies CP, SOB, some DOE, no palpitations.  Occasional cramping in hands. Sleeps well and wakes up 2-3 times per night, not necessarily having to urinate. Still tries to stay productive.    History obtained from review of EMR, discussion with primary team, and interview with family, facility staff/caregiver  and/or Dean Gonzalez.  I reviewed available labs, medications, imaging, studies and related documents from the EMR.  Records reviewed and summarized above.   ROS General: NAD EYES: denies vision changes ENMT: denies dysphagia Cardiovascular: denies chest pain, denies DOE Pulmonary: denies cough, denies increased SOB Abdomen: endorses good appetite, denies constipation, endorses continence of bowel GU: denies dysuria, endorses continence of urine MSK:  denies increased weakness, no falls reported Skin: denies rashes or wounds Neurological: denies pain, denies insomnia Psych: Endorses positive mood Heme/lymph/immuno: denies bruises, abnormal bleeding  Physical Exam: Current and past weights: Constitutional: NAD General: frail appearing, thin/WNWD/obese  EYES: anicteric sclera, lids intact, no discharge  ENMT: intact hearing, oral mucous membranes moist, dentition intact CV: S1S2, RRR, no LE edema Pulmonary: CTAB, no increased work of breathing, no cough, room air Abdomen: intake 100%, normo-active BS + 4 quadrants, soft and non tender, no ascites GU: deferred MSK: no sarcopenia, moves all extremities, ambulatory Skin: warm and dry, no rashes or wounds on visible skin Neuro:  no generalized weakness, no cognitive impairment Psych: non-anxious affect, A and O x 3 Hem/lymph/immuno: no widespread bruising  CURRENT PROBLEM LIST: There are no problems to display for this patient.  PAST MEDICAL HISTORY:  Active Ambulatory Problems    Diagnosis Date Noted   No Active Ambulatory Problems   Resolved Ambulatory Problems    Diagnosis Date Noted   No Resolved Ambulatory Problems   No Additional Past Medical History   SOCIAL HX:  Social History   Tobacco Use   Smoking status:  Unknown   Smokeless tobacco: Not on file  Substance Use Topics   Alcohol use: Not on file   FAMILY HX: No family hx of heart disease, kidney disease or diabetes.   Preferred Pharmacy: ALLERGIES:  Allergies   Allergen Reactions   Doxycycline Shortness Of Breath    Other reaction(s): shortness of breath     PERTINENT MEDICATIONS:  Outpatient Encounter Medications as of 05/03/2022  Medication Sig   colestipol (COLESTID) 1 g tablet Take 1 g by mouth 2 (two) times daily.   finasteride (PROSCAR) 5 MG tablet Take 5 mg by mouth daily.   furosemide (LASIX) 40 MG tablet Take 1 tablet (40 mg total) by mouth daily.   isosorbide mononitrate (IMDUR) 30 MG 24 hr tablet Take 1 tablet (30 mg total) by mouth daily.   metoprolol succinate (TOPROL-XL) 25 MG 24 hr tablet Take 12.5 mg by mouth daily.   nitroGLYCERIN (NITROSTAT) 0.4 MG SL tablet Place 0.4 mg under the tongue every 2 (two) hours as needed.   No facility-administered encounter medications on file as of 05/03/2022.   Calcium acetate TID Sodium bicarbonate 10 gm TID Calcitriol 5 mg daily ASA 81 mg every 3rd day Not on Toprol XL any longer  -------------------------------------------------------------------------------------------------------------------------------------------------------------------------------------------------------------------------------------------- Advance Care Planning/Goals of Care: Goals include to maximize quality of life and symptom management. Patient/health care surrogate gave his/her permission to discuss.Our advance care planning conversation included a discussion about:    The value and importance of advance care planning  Experiences with loved ones who have been seriously ill or have died  Exploration of personal, cultural or spiritual beliefs that might influence medical decisions  Exploration of goals of care in the event of a sudden injury or illness  Identification  of a healthcare agent  Review and updating or creation of an  advance directive document . Decision not to resuscitate or to de-escalate disease focused treatments due to poor prognosis. CODE STATUS:  I spent *** minutes providing this consultation  providing Palliative Care counseling on goals of care. More than 50% of the time in this consultation was spent in counseling and care coordination.   Thank you for the opportunity to participate in the care of Dean Gonzalez.  The palliative care team will continue to follow. Please call our office at [phone] if we can be of additional assistance.   Marijo Conception, FNP-C  COVID-19 PATIENT SCREENING TOOL Asked and negative response unless otherwise noted:  Have you had symptoms of covid, tested positive or been in contact with someone with symptoms/positive test in the past 5-10 days?

## 2022-05-04 ENCOUNTER — Encounter (HOSPITAL_COMMUNITY)
Admission: RE | Admit: 2022-05-04 | Discharge: 2022-05-04 | Disposition: A | Discharge status: Home / Self Care | Payer: Medicare Other | Source: Ambulatory Visit | Attending: Nephrology | Admitting: Nephrology

## 2022-05-04 VITALS — BP 116/63 | HR 56 | Temp 97.6°F | Resp 17

## 2022-05-04 DIAGNOSIS — N185 Chronic kidney disease, stage 5: Secondary | ICD-10-CM | POA: Insufficient documentation

## 2022-05-04 DIAGNOSIS — D631 Anemia in chronic kidney disease: Secondary | ICD-10-CM | POA: Insufficient documentation

## 2022-05-04 DIAGNOSIS — N189 Chronic kidney disease, unspecified: Secondary | ICD-10-CM | POA: Insufficient documentation

## 2022-05-04 LAB — POCT HEMOGLOBIN-HEMACUE: Hemoglobin: 9.5 g/dL — ABNORMAL LOW (ref 13.0–17.0)

## 2022-05-04 MED ORDER — DARBEPOETIN ALFA 40 MCG/0.4ML IJ SOSY
40.0000 ug | PREFILLED_SYRINGE | INTRAMUSCULAR | Status: DC
  Administered 2022-05-04: 40 ug via SUBCUTANEOUS

## 2022-05-04 MED ORDER — DARBEPOETIN ALFA 40 MCG/0.4ML IJ SOSY
PREFILLED_SYRINGE | INTRAMUSCULAR | Status: AC
Start: 2022-05-04 — End: 2022-05-04
  Filled 2022-05-04: qty 0.4

## 2022-05-05 ENCOUNTER — Encounter: Payer: Self-pay | Admitting: Family Medicine

## 2022-05-05 DIAGNOSIS — Z515 Encounter for palliative care: Secondary | ICD-10-CM | POA: Insufficient documentation

## 2022-05-18 ENCOUNTER — Encounter (HOSPITAL_COMMUNITY)
Admission: RE | Admit: 2022-05-18 | Discharge: 2022-05-18 | Disposition: A | Discharge status: Home / Self Care | Payer: Medicare Other | Source: Ambulatory Visit | Attending: Nephrology | Admitting: Nephrology

## 2022-05-18 ENCOUNTER — Telehealth: Payer: Self-pay | Admitting: Family Medicine

## 2022-05-18 VITALS — BP 93/55 | HR 62 | Temp 97.5°F | Resp 20

## 2022-05-18 DIAGNOSIS — N185 Chronic kidney disease, stage 5: Secondary | ICD-10-CM | POA: Diagnosis not present

## 2022-05-18 DIAGNOSIS — N189 Chronic kidney disease, unspecified: Secondary | ICD-10-CM | POA: Diagnosis not present

## 2022-05-18 DIAGNOSIS — D631 Anemia in chronic kidney disease: Secondary | ICD-10-CM

## 2022-05-18 LAB — POCT HEMOGLOBIN-HEMACUE: Hemoglobin: 10.3 g/dL — ABNORMAL LOW (ref 13.0–17.0)

## 2022-05-18 MED ORDER — DARBEPOETIN ALFA 40 MCG/0.4ML IJ SOSY: 40.0000 ug | PREFILLED_SYRINGE | INTRAMUSCULAR | Status: DC

## 2022-05-18 MED ORDER — DARBEPOETIN ALFA 40 MCG/0.4ML IJ SOSY
PREFILLED_SYRINGE | INTRAMUSCULAR | Status: AC
  Administered 2022-05-18: 40 ug via SUBCUTANEOUS
  Filled 2022-05-18: qty 0.4

## 2022-05-18 NOTE — Telephone Encounter (Signed)
Spoke with pt's daughter and she says pt c/o abd pain and some chest pain earlier today.  She stateshe is  better at present, is using the walker, not sleeping more than usual or having less urine output.  Nephrologist gave her a list of sx to watch for which would indicate a decline.  Per EMR pt received treatment today at infusion center for anemia.  Spoke with Dr Lyman Speller who does all hospice CTIs for [facility] and advised pt with ESRD, not on hemodialysis with PPS 70% and Cr Cl 9.  Dr Lyman Speller indicated that if the patient does not have symptom management needs at the present time, is very independent with PPS 70% and not showing any decline that he would recommend to wait to refer patient when 6  month prognosis more clear based off the decline but to keep close monitoring.  Damaris Hippo FNP-C

## 2022-05-31 ENCOUNTER — Encounter: Payer: Self-pay | Admitting: Family Medicine

## 2022-05-31 ENCOUNTER — Other Ambulatory Visit: Payer: Medicare Other | Admitting: Family Medicine

## 2022-05-31 VITALS — BP 104/60 | HR 56 | Resp 18

## 2022-05-31 DIAGNOSIS — Z905 Acquired absence of kidney: Secondary | ICD-10-CM

## 2022-05-31 DIAGNOSIS — R1084 Generalized abdominal pain: Secondary | ICD-10-CM

## 2022-05-31 DIAGNOSIS — N3000 Acute cystitis without hematuria: Secondary | ICD-10-CM | POA: Insufficient documentation

## 2022-05-31 DIAGNOSIS — N186 End stage renal disease: Secondary | ICD-10-CM

## 2022-05-31 NOTE — Progress Notes (Signed)
[facility] Palliative Care Consult Note Telephone: [phone]  Fax: [phone]    Date of encounter: 05/31/22 9:50 AM PATIENT NAME: Dean Gonzalez [place] [address]   [phone] (home)  DOB: [birth year] MRN: [id number] PRIMARY CARE PROVIDER:    Caren Macadam, MD,  [place] [address] [place]:   Dean Gonzalez, [place],  [place] [address] [phone]  RESPONSIBLE PARTY:    Contact Information     Name Relation Home Work Mobile   Dean Gonzalez  [phone]  [phone]        I met face to face with patient and family in his home. Palliative Care was asked to follow this patient by consultation request of  Dean Macadam, MD to address advance care planning and complex medical decision making. This is a follow up visit.                       ASSESSMENT, SYMPTOM MANAGEMENT AND PLAN / RECOMMENDATIONS:   End stage renal disease/single kidney Decline in Cr CL from 16 to 9 mg/ml, decline in weight 10 lbs over 3 months PPS with slight decline Dean Gonzalez, nephrologist in agreement with 6 month time frame if follows current life expectancy  2.  Generalized abdominal pain Can try some limited ASA for pain relief 81 mg 1-2 times per day prn For severe pain, take OxyCodone-APAP 5/325 mg Q 8 hrs prn (disp 7 days worth)   UTI Continues on Cephalexin 500 mg TID x 7 days, started yesterday  Advance Care Planning/Goals of Care: Goals include to maximize quality of life and symptom management.    Identification of a healthcare agent-daughter Dean Gonzalez Review of an advance directive document-MOST Decision not to resuscitate or to de-escalate disease focused treatments due to poor prognosis. CODE STATUS: MOST as of 05/03/22: DNR/DNI with comfort measures Antibiotics if indicated IV fluids for defined trial period No feeding tube    Follow up Palliative Care Visit:  Palliative care will defer to Hospice admission team on whether or not additional visits required    This visit was coded based on medical decision making (MDM).  PPS: 60%  HOSPICE ELIGIBILITY/DIAGNOSIS: TBD  Chief Complaint:  Palliative Care is following for chronic medical management of end stage renal disease with worsening of intermittent abdominal pain.  HISTORY OF PRESENT ILLNESS:  Dean Gonzalez is a 88 y.o. year old male with end stage renal failure and anemia of CKD. 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 He also has 45 mm dilated ascending aortic aneurysm, CAD, and chronic systolic heart failure. Has UTI, now on antibiotics. Has been having severe midabdominal pain intermittently. He has had this evaluated previously without any concrete etiology. Had been instructed to take NTG when having CP and abd pain but has seen no improvement. He did have some improvement with taking ASA.  He had not eaten prior to one episode and the other had been 3-4 hours prior.  Notes no change with eating or elimination. Has a bladder diverticulum and large stone with urinary retention, has to go 4-5 times to empty his bladder. Daughter is wanting to pursue possible hospice referral.  I advised that given his high functional independence and stability of poor renal function over the last 4 months he advised waiting to see some decline.  Pt's current weight was 134 lbs.  Daughter states he carries a pedometer  and was getting 1000 steps in per day in February and now only getting in 300. His most recent creatinine clearance per Dean Gonzalez office Cr 5.42, eGFR 9, Na 139 as of 05/18/22 with calculated Cr Cl 7 mg/ml.  Pt never sleeps well, coping well except for abd pain and intermittent bowel incontinence with loose stools.  He has had some trouble with low BP so Cardiologist had recommended holding his Isosorbide. Spoke with Dean Gonzalez, Dean Gonzalez assistant who states she spoke with Dean Gonzalez yesterday and  he indicated a referral to Hospice was appropriate and he would defer attending status to Hospice doctor.   In October 27/2022, Cr 2.51, eGFR 23, CR cl at that time was 16 mg/ml.  History obtained from review of EMR, discussion with Nephrology caregivers, and interview with family and/or Dean Gonzalez.  I reviewed available labs, medications, imaging, studies and related documents from the EMR.  Records reviewed and summarized above.   ROS General: NAD Cardiovascular: denies chest pain, denies DOE Pulmonary: denies cough, denies increased SOB Abdomen: endorses good appetite, denies constipation, endorses intermittent incontinence of bowel GU: denies dysuria, endorses continence of urine MSK:  reports intermittent increased weakness,  no falls reported Neurological: endorses intermittent abdominal pain and long standing insomnia Psych: Endorses positive mood   Physical Exam: Current and past weights: 134 lbs currently on home scale, 144 lbs 02/21/22 Constitutional: NAD General: frail appearing, thin CV: S1S2, slow regular rhythm, mildly bradycardic rate, no LE edema Pulmonary: CTAB, no increased work of breathing, no cough, room air Abdomen: normo-active BS + 4 quadrants, soft and non tender MSK: noted muscle and subcu fat wasting in all 4 extremities, moves all extremities, ambulatory with unsteady gait on standing, has walker but not using Skin: warm and dry, no rashes or wounds on visible skin Neuro:  no generalized weakness,  no cognitive impairment Psych: non-anxious affect, A and O x 3 Hem/lymph/immuno: no widespread bruising   Thank you for the opportunity to participate in the care of Dean Gonzalez.  The palliative care team will continue to follow. Please call our office at [phone] if we can be of additional assistance.   Marijo Conception, FNP   COVID-19 PATIENT SCREENING TOOL Asked and negative response unless otherwise noted:   Have you had symptoms of covid, tested positive  or been in contact with someone with symptoms/positive test in the past 5-10 days?

## 2022-06-01 ENCOUNTER — Encounter (HOSPITAL_COMMUNITY)
Admission: RE | Admit: 2022-06-01 | Discharge: 2022-06-01 | Disposition: A | Discharge status: Home / Self Care | Payer: Medicare Other | Source: Ambulatory Visit | Attending: Nephrology | Admitting: Nephrology

## 2022-06-01 VITALS — BP 102/61 | HR 58 | Temp 97.7°F | Resp 18

## 2022-06-01 DIAGNOSIS — D631 Anemia in chronic kidney disease: Secondary | ICD-10-CM

## 2022-06-01 DIAGNOSIS — N185 Chronic kidney disease, stage 5: Secondary | ICD-10-CM | POA: Diagnosis not present

## 2022-06-01 DIAGNOSIS — N189 Chronic kidney disease, unspecified: Secondary | ICD-10-CM | POA: Diagnosis not present

## 2022-06-01 LAB — POCT HEMOGLOBIN-HEMACUE: Hemoglobin: 11.7 g/dL — ABNORMAL LOW (ref 13.0–17.0)

## 2022-06-01 LAB — IRON AND TIBC
Iron: 50 ug/dL (ref 45–182)
Saturation Ratios: 19 % (ref 17.9–39.5)
TIBC: 262 ug/dL (ref 250–450)
UIBC: 212 ug/dL

## 2022-06-01 LAB — FERRITIN: Ferritin: 99 ng/mL (ref 24–336)

## 2022-06-01 MED ORDER — DARBEPOETIN ALFA 40 MCG/0.4ML IJ SOSY
PREFILLED_SYRINGE | INTRAMUSCULAR | Status: AC
  Filled 2022-06-01: qty 0.4

## 2022-06-01 MED ORDER — DARBEPOETIN ALFA 40 MCG/0.4ML IJ SOSY
40.0000 ug | PREFILLED_SYRINGE | INTRAMUSCULAR | Status: DC
  Administered 2022-06-01: 40 ug via SUBCUTANEOUS

## 2022-06-15 ENCOUNTER — Encounter (HOSPITAL_COMMUNITY): Payer: Medicare Other

## 2022-07-12 ENCOUNTER — Ambulatory Visit: Payer: Medicare Other | Admitting: Podiatry

## 2022-07-30 ENCOUNTER — Encounter: Payer: Self-pay | Admitting: Podiatry

## 2022-07-30 ENCOUNTER — Ambulatory Visit (INDEPENDENT_AMBULATORY_CARE_PROVIDER_SITE_OTHER): Payer: Medicare Other | Admitting: Podiatry

## 2022-07-30 DIAGNOSIS — B351 Tinea unguium: Secondary | ICD-10-CM

## 2022-07-30 DIAGNOSIS — M79675 Pain in left toe(s): Secondary | ICD-10-CM | POA: Diagnosis not present

## 2022-07-30 DIAGNOSIS — M79674 Pain in right toe(s): Secondary | ICD-10-CM

## 2022-07-30 DIAGNOSIS — N186 End stage renal disease: Secondary | ICD-10-CM | POA: Diagnosis not present

## 2022-07-30 NOTE — Progress Notes (Signed)
This patient returns to my office for at risk foot care.  This patient requires this care by a professional since this patient will be at risk due to having  ESRD.  This patient is unable to cut nails himself since the patient cannot reach his nails.These nails are painful walking and wearing shoes.  This patient presents for at risk foot care today.  General Appearance  Alert, conversant and in no acute stress.  Vascular  Dorsalis pedis and posterior tibial  pulses are  weakly palpable  bilaterally.  Capillary return is within normal limits  bilaterally. Temperature is within normal limits  bilaterally.  Neurologic  Senn-Weinstein monofilament wire test within normal limits  bilaterally. Muscle power within normal limits bilaterally.  Nails Thick disfigured discolored nails with subungual debris  from hallux to fifth toes bilaterally. No evidence of bacterial infection or drainage bilaterally.  Orthopedic  No limitations of motion  feet .  No crepitus or effusions noted.  No bony pathology or digital deformities noted.  Skin  normotropic skin with no porokeratosis noted bilaterally.  No signs of infections or ulcers noted.     Onychomycosis  Pain in right toes  Pain in left toes  Consent was obtained for treatment procedures.   Mechanical debridement of nails 1-5  bilaterally performed with a nail nipper.  Filed with dremel without incident.    Return office visit  3 months                    Told patient to return for periodic foot care and evaluation due to potential at risk complications.   Gardiner Barefoot DPM

## 2022-08-30 ENCOUNTER — Ambulatory Visit: Payer: PRIVATE HEALTH INSURANCE | Admitting: Internal Medicine

## 2022-10-03 DEATH — deceased

## 2022-10-24 ENCOUNTER — Encounter (HOSPITAL_COMMUNITY): Payer: Self-pay

## 2022-10-31 ENCOUNTER — Ambulatory Visit: Payer: Medicare Other | Admitting: Podiatry
